# Patient Record
Sex: Male | Born: 1970 | Race: White | Hispanic: No | Marital: Married
Health system: Southern US, Community
[De-identification: ages and names within clinical notes are randomized; demographics above are authoritative.]

---

## 2004-05-14 ENCOUNTER — Emergency Department (HOSPITAL_COMMUNITY): Admission: EM | Admit: 2004-05-14 | Discharge: 2004-05-14 | Payer: Self-pay | Admitting: Emergency Medicine

## 2004-05-21 ENCOUNTER — Ambulatory Visit: Payer: Self-pay | Admitting: Internal Medicine

## 2004-05-28 ENCOUNTER — Ambulatory Visit: Payer: Self-pay

## 2004-06-10 ENCOUNTER — Ambulatory Visit: Payer: Self-pay | Admitting: Internal Medicine

## 2006-07-15 IMAGING — CR DG CHEST 1V PORT
1 series · 1 of 1 positions shown · non-contrast
Comparison: none

CLINICAL DATA: Chest pain.
 PORTABLE CHEST, ONE VIEW ? 05/14/2004:

  The heart size and mediastinal contours are within normal limits. The lungs are clear.

[view not recorded]
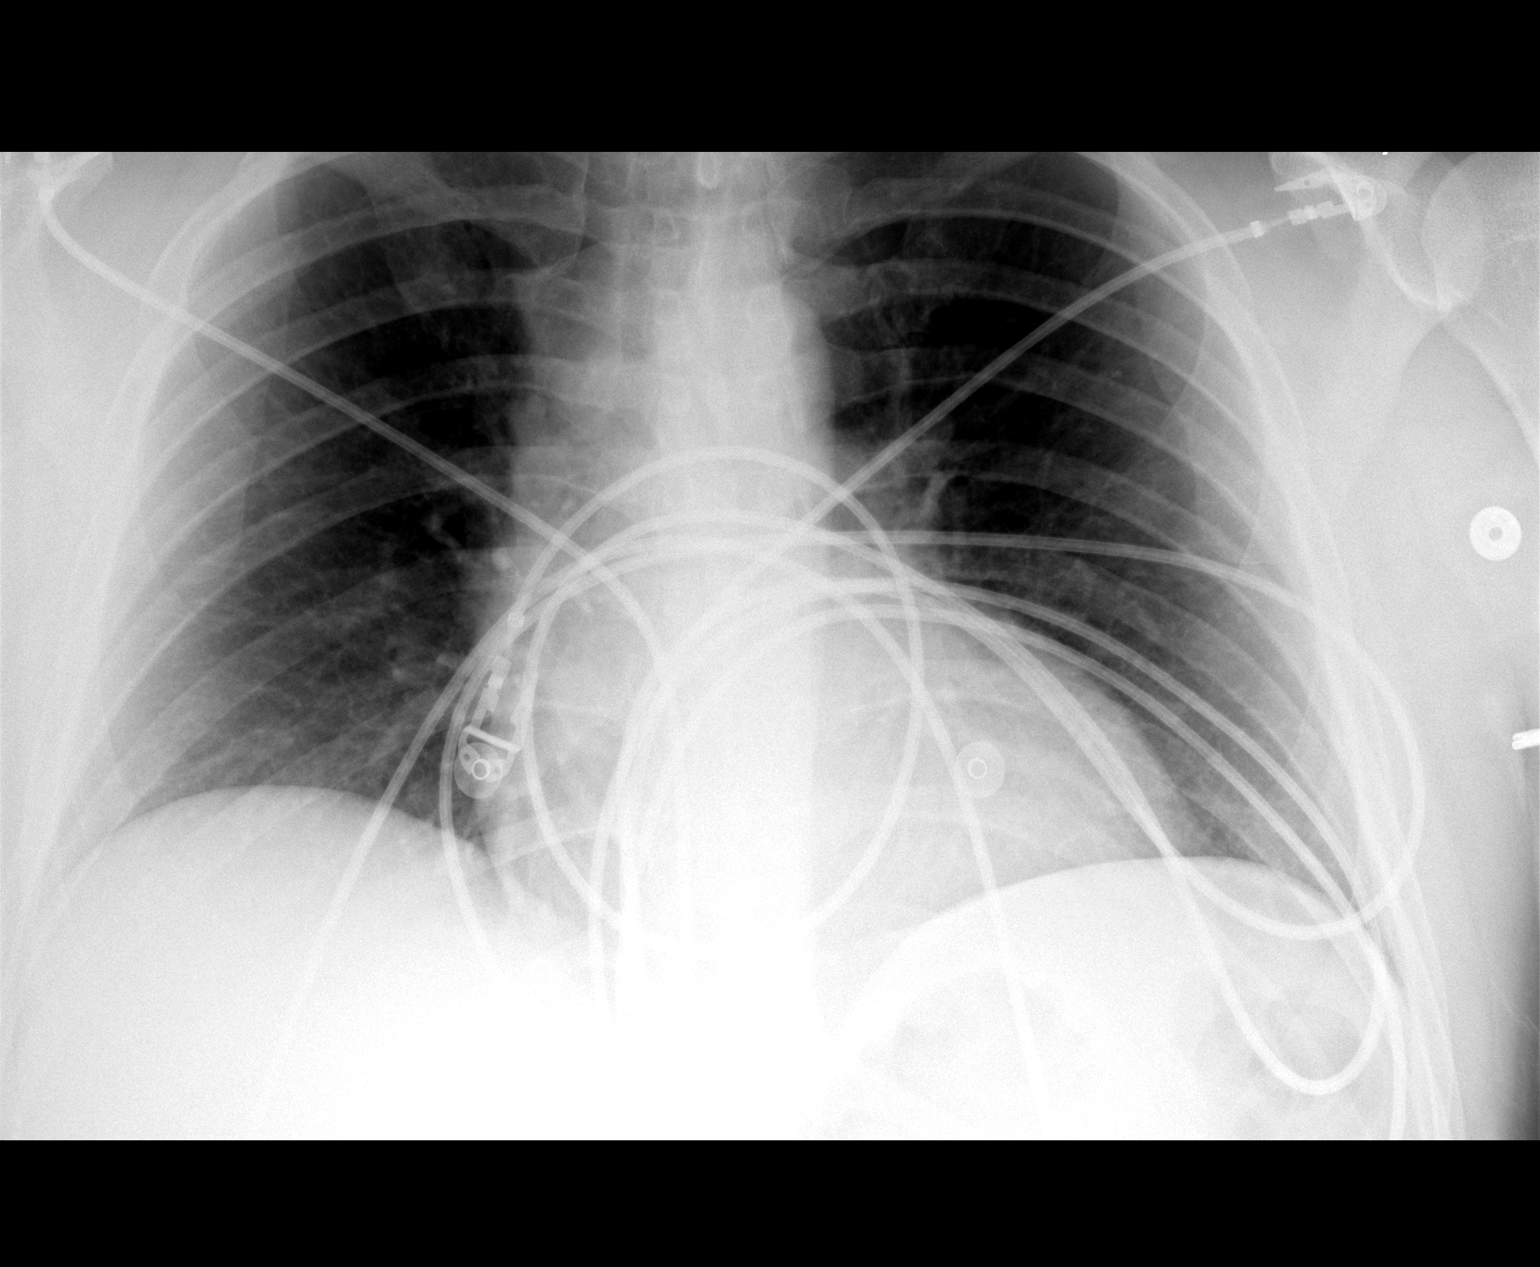

[1 of 1 positions shown; findings below may reference images not displayed]

IMPRESSION: No acute disease.

## 2020-08-14 ENCOUNTER — Emergency Department
Admission: EM | Admit: 2020-08-14 | Discharge: 2020-08-14 | Disposition: A | Discharge status: Home / Self Care | Payer: Self-pay | Attending: Emergency Medicine | Admitting: Emergency Medicine

## 2020-08-14 ENCOUNTER — Other Ambulatory Visit: Payer: Self-pay

## 2020-08-14 DIAGNOSIS — S51811A Laceration without foreign body of right forearm, initial encounter: Secondary | ICD-10-CM | POA: Insufficient documentation

## 2020-08-14 DIAGNOSIS — Z23 Encounter for immunization: Secondary | ICD-10-CM | POA: Insufficient documentation

## 2020-08-14 DIAGNOSIS — W268XXA Contact with other sharp object(s), not elsewhere classified, initial encounter: Secondary | ICD-10-CM | POA: Insufficient documentation

## 2020-08-14 MED ORDER — LIDOCAINE-EPINEPHRINE 2 %-1:100000 IJ SOLN
20.0000 mL | Freq: Once | INTRAMUSCULAR | Status: AC
Start: 2020-08-14 — End: 2020-08-14
  Administered 2020-08-14: 20 mL

## 2020-08-14 MED ORDER — CEPHALEXIN 500 MG PO CAPS: 500.0000 mg | ORAL_CAPSULE | Freq: Three times a day (TID) | ORAL | 0 refills | Status: AC

## 2020-08-14 MED ORDER — TETANUS-DIPHTH-ACELL PERTUSSIS 5-2.5-18.5 LF-MCG/0.5 IM SUSY
0.5000 mL | PREFILLED_SYRINGE | Freq: Once | INTRAMUSCULAR | Status: AC
  Administered 2020-08-14: 0.5 mL via INTRAMUSCULAR
  Filled 2020-08-14: qty 0.5

## 2020-08-14 NOTE — Discharge Instructions (Addendum)
Have sutures removed in seven days.  Take Keflex three times daily for seven days.  

## 2020-08-14 NOTE — ED Provider Notes (Signed)
[facility]-EMERGENCY DEPARTMENT  ____________________________________________  Time seen: Approximately 11:13 PM  I have reviewed the triage vital signs and the nursing notes.   HISTORY  Chief Complaint Laceration   Historian Patient     HPI Dale Gilbert is a 50 y.o. male presents to the emergency department with a 4 cm right anterior forearm laceration sustained accidentally from sheet-metal.  Patient cannot recall his last tetanus shot.  No numbness or tingling in the right forearm.  No similar injuries in the past.  No other alleviating measures have been attempted.   No past medical history on file.   Immunizations up to date:  Yes.     No past medical history on file.  There are no problems to display for this patient.    Prior to Admission medications   Medication Sig Start Date End Date Taking? Authorizing Provider  cephALEXin (KEFLEX) 500 MG capsule Take 1 capsule (500 mg total) by mouth 3 (three) times daily for 7 days. 08/14/20 08/21/20 Yes Orvil Feil, PA-C    Allergies Patient has no known allergies.  No family history on file.  Social History     Review of Systems  Constitutional: No fever/chills Eyes:  No discharge ENT: No upper respiratory complaints. Respiratory: no cough. No SOB/ use of accessory muscles to breath Gastrointestinal:   No nausea, no vomiting.  No diarrhea.  No constipation. Musculoskeletal: Negative for musculoskeletal pain. Skin: Patient has laceration.     ____________________________________________   PHYSICAL EXAM:  VITAL SIGNS: ED Triage Vitals  Enc Vitals Group     BP 08/14/20 1541 122/83     Pulse Rate 08/14/20 1541 61     Resp 08/14/20 1541 18     Temp 08/14/20 1541 (!) 97.3 F (36.3 C)     Temp Source 08/14/20 1541 Oral     SpO2 08/14/20 1541 96 %     Weight 08/14/20 1543 265 lb (120.2 kg)     Height 08/14/20 1543 6\' 1"  (1.854 m)     Head Circumference --      Peak Flow --      Pain Score 08/14/20  1543 1     Pain Loc --      Pain Edu? --      Excl. in GC? --      Constitutional: Alert and oriented. Well appearing and in no acute distress. Eyes: Conjunctivae are normal. PERRL. EOMI. Head: Atraumatic. ENT: Cardiovascular: Normal rate, regular rhythm. Normal S1 and S2.  Good peripheral circulation. Respiratory: Normal respiratory effort without tachypnea or retractions. Lungs CTAB. Good air entry to the bases with no decreased or absent breath sounds Gastrointestinal: Bowel sounds x 4 quadrants. Soft and nontender to palpation. No guarding or rigidity. No distention. Musculoskeletal: Full range of motion to all extremities. No obvious deformities noted Neurologic:  Normal for age. No gross focal neurologic deficits are appreciated.  Skin: Patient has 4 cm right anterior forearm laceration deep to underlying adipose tissue. Psychiatric: Mood and affect are normal for age. Speech and behavior are normal.   ____________________________________________   LABS (all labs ordered are listed, but only abnormal results are displayed)  Labs Reviewed - No data to display ____________________________________________  EKG   ____________________________________________  RADIOLOGY   No results found.  ____________________________________________    PROCEDURES  Procedure(s) performed:     05/22/22Marland KitchenLaceration Repair  Date/Time: 08/14/2020 11:14 PM Performed by: 08/16/2020, PA-C Authorized by: Orvil Feil, Orvil Feil   Consent:    Consent  obtained:  Verbal   Consent given by:  Patient   Risks discussed:  Infection, pain, retained foreign body, poor cosmetic result and poor wound healing Universal protocol:    Procedure explained and questions answered to patient or proxy's satisfaction: yes     Patient identity confirmed:  Verbally with patient Anesthesia:    Anesthesia method:  Local infiltration   Local anesthetic:  Lidocaine 1% w/o epi Laceration details:    Location:   Shoulder/arm   Shoulder/arm location:  R lower arm Exploration:    Hemostasis achieved with:  Direct pressure   Contaminated: no   Treatment:    Area cleansed with:  Saline   Amount of cleaning:  Extensive   Irrigation solution:  Sterile saline   Visualized foreign bodies/material removed: no   Skin repair:    Repair method:  Sutures   Suture size:  4-0   Suture material:  Nylon   Suture technique:  Running locked   Number of sutures:  10 Approximation:    Approximation:  Close Repair type:    Repair type:  Simple Post-procedure details:    Dressing:  Sterile dressing   Procedure completion:  Tolerated well, no immediate complications       Medications  lidocaine-EPINEPHrine (XYLOCAINE W/EPI) 2 %-1:100000 (with pres) injection 20 mL (20 mLs Infiltration Given by Other 08/14/20 1638)  Tdap (BOOSTRIX) injection 0.5 mL (0.5 mLs Intramuscular Given 08/14/20 1653)     ____________________________________________   INITIAL IMPRESSION / ASSESSMENT AND PLAN / ED COURSE  Pertinent labs & imaging results that were available during my care of the patient were reviewed by me and considered in my medical decision making (see chart for details).       Assessment and plan Laceration 50 year old male presents to the emergency department with a right forearm laceration that was sustained from sheet-metal.  Vital signs are reassuring at triage.  On physical exam, patient was alert, active and nontoxic-appearing.  Wound was copiously irrigated in the emergency department.  Multiple foreign bodies were removed during laceration repair and explained to patient that his wound was increased risk for infection.  He was discharged with Keflex.  He was advised to have external sutures removed in 7 days.  His tetanus status was updated prior to discharge.  Return precautions were given to return with redness or streaking surrounding the wound  site.    ____________________________________________  FINAL CLINICAL IMPRESSION(S) / ED DIAGNOSES  Final diagnoses:  Laceration of right forearm, initial encounter      NEW MEDICATIONS STARTED DURING THIS VISIT:  ED Discharge Orders         Ordered    cephALEXin (KEFLEX) 500 MG capsule  3 times daily        08/14/20 1648              This chart was dictated using voice recognition software/Dragon. Despite best efforts to proofread, errors can occur which can change the meaning. Any change was purely unintentional.     Orvil Feil, PA-C 08/14/20 2316    Phineas Semen, MD 08/14/20 [id number]

## 2020-08-14 NOTE — ED Triage Notes (Signed)
Pt has lac from sheet metal to rt forearm. Tetanus is not UTD

## 2020-08-14 NOTE — ED Notes (Signed)
ED Provider at bedside.
